# Patient Record
Sex: Male | Born: 1985 | Race: White | Hispanic: No | Marital: Single | Smoking: Never smoker
Health system: Southern US, Community
[De-identification: ages and names within clinical notes are randomized; demographics above are authoritative.]

---

## 2005-12-14 ENCOUNTER — Emergency Department: Payer: Self-pay | Admitting: Emergency Medicine

## 2007-06-21 IMAGING — CR DG ELBOW COMPLETE 3+V*L*
1 series · 4 of 4 positions shown · non-contrast
Comparison: none

REASON FOR EXAM: ELBOW PAIN
COMMENTS:

PROCEDURE:     DXR - DXR ELBOW LT COMP W/OBLIQUES  - December 14, 2005  [DATE]
RESULT:          There does not appear to be evidence of fracture,
dislocation, or malalignment.  No anterior or posterior fat pad signs are
appreciated.

[Series 1: view not recorded · 0.17mm/px · 4 of 4 slices shown]
[im 1/4]
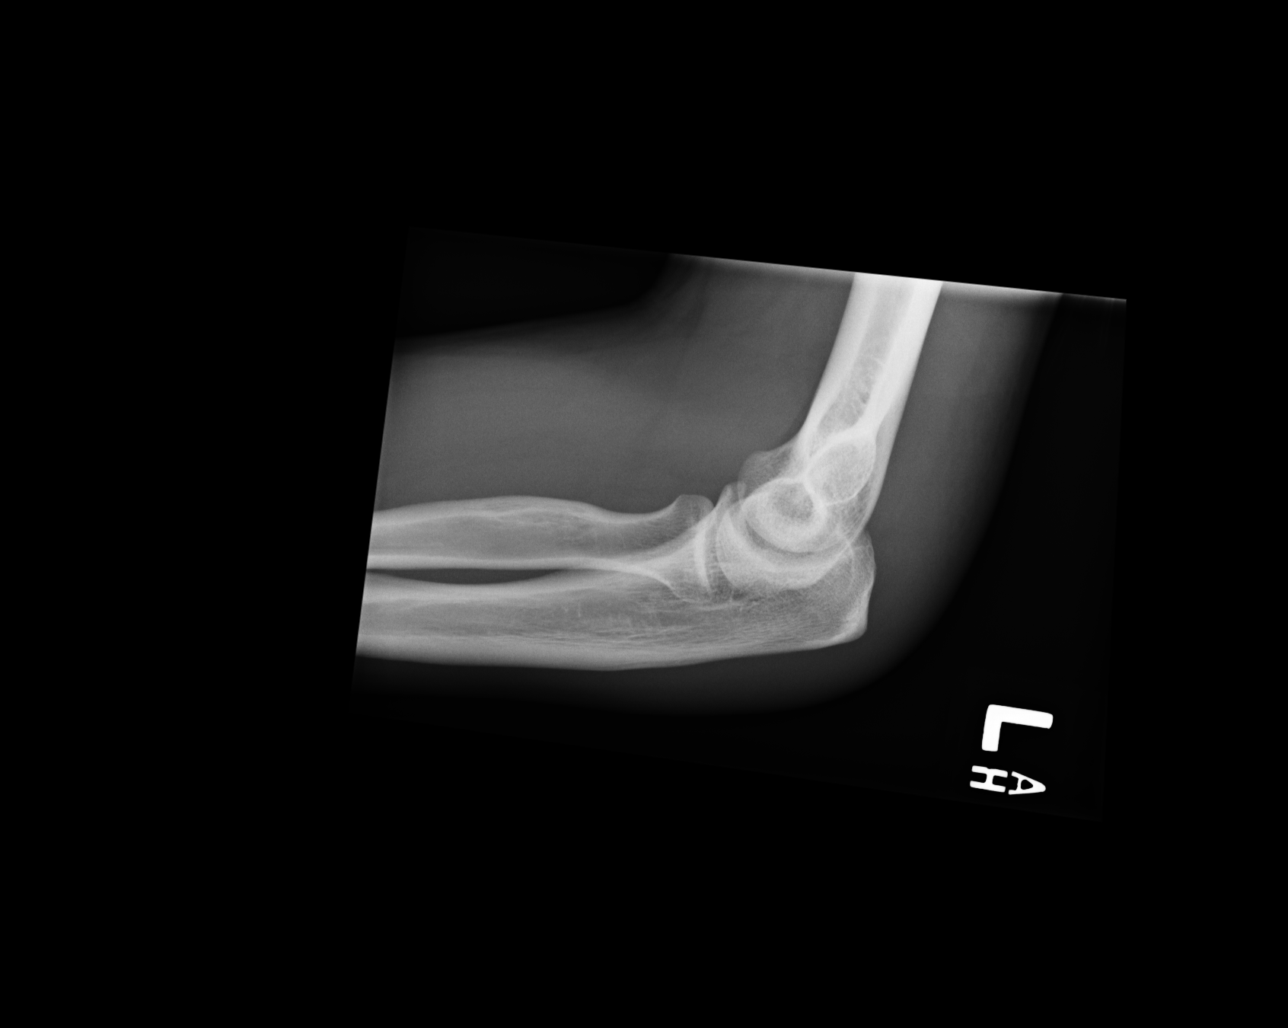
[im 2/4]
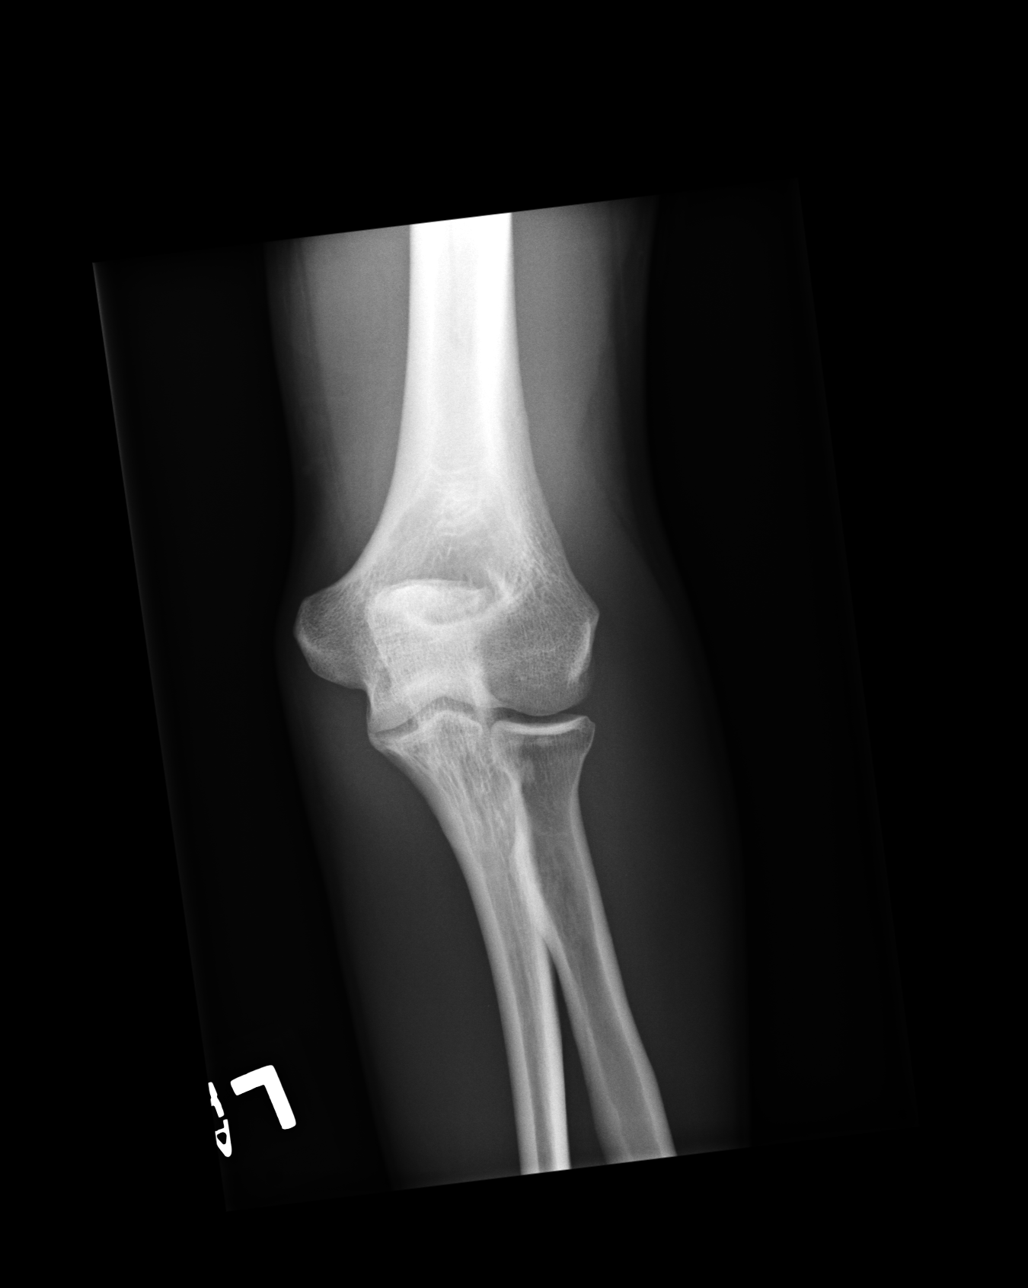
[im 3/4]
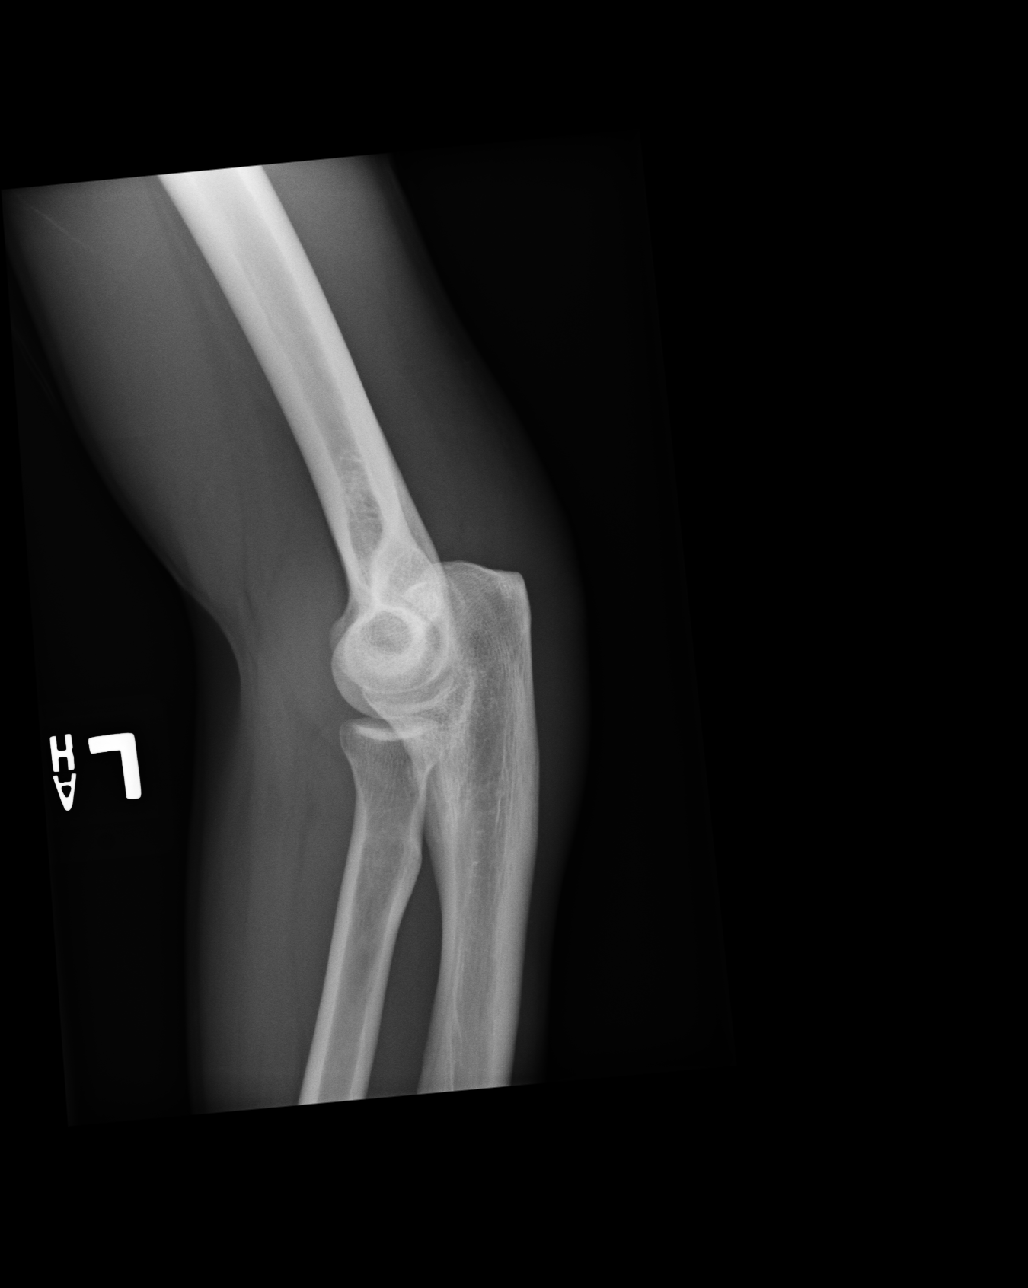
[im 4/4]
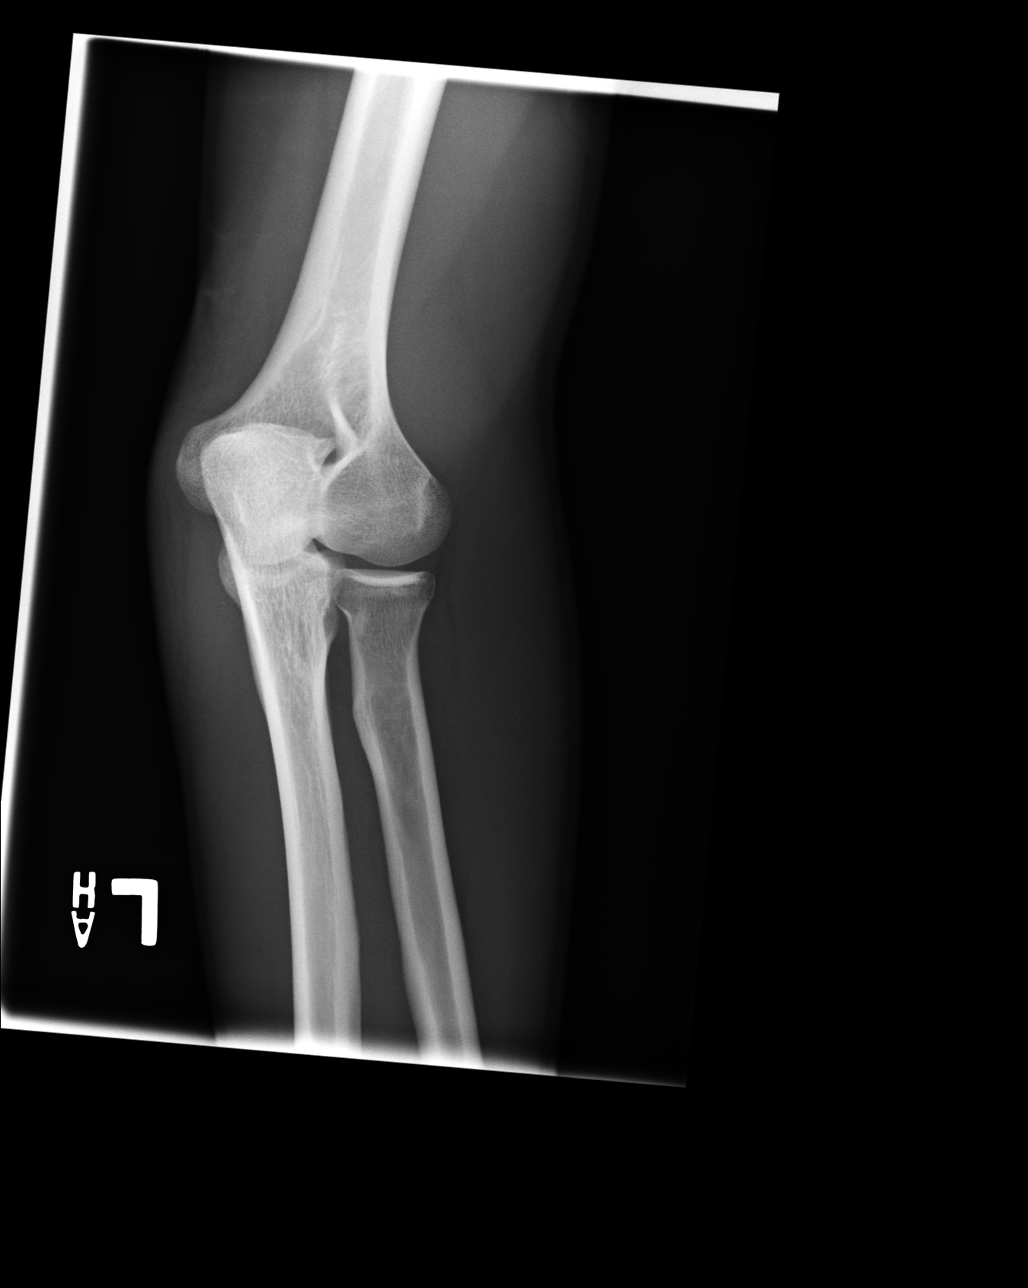

[4 of 4 positions shown; findings below may reference images not displayed]

IMPRESSION: No acute abnormalities involving the LEFT elbow. Repeat
evaluation in 7-10 days can be obtained if clinically warranted.

## 2018-01-17 ENCOUNTER — Ambulatory Visit
Admission: EM | Admit: 2018-01-17 | Discharge: 2018-01-17 | Disposition: A | Discharge status: Home / Self Care | Payer: BLUE CROSS/BLUE SHIELD | Attending: Emergency Medicine | Admitting: Emergency Medicine

## 2018-01-17 DIAGNOSIS — M791 Myalgia, unspecified site: Secondary | ICD-10-CM

## 2018-01-17 DIAGNOSIS — J3489 Other specified disorders of nose and nasal sinuses: Secondary | ICD-10-CM | POA: Diagnosis not present

## 2018-01-17 DIAGNOSIS — R69 Illness, unspecified: Principal | ICD-10-CM

## 2018-01-17 DIAGNOSIS — R51 Headache: Secondary | ICD-10-CM

## 2018-01-17 DIAGNOSIS — J111 Influenza due to unidentified influenza virus with other respiratory manifestations: Secondary | ICD-10-CM

## 2018-01-17 DIAGNOSIS — R0981 Nasal congestion: Secondary | ICD-10-CM

## 2018-01-17 DIAGNOSIS — J029 Acute pharyngitis, unspecified: Secondary | ICD-10-CM | POA: Diagnosis not present

## 2018-01-17 MED ORDER — FLUTICASONE PROPIONATE 50 MCG/ACT NA SUSP: 2.0000 | Freq: Every day | NASAL | 0 refills | Status: AC

## 2018-01-17 MED ORDER — OSELTAMIVIR PHOSPHATE 75 MG PO CAPS: 75.0000 mg | ORAL_CAPSULE | Freq: Two times a day (BID) | ORAL | 0 refills | Status: AC

## 2018-01-17 MED ORDER — IBUPROFEN 600 MG PO TABS: 600.0000 mg | ORAL_TABLET | Freq: Four times a day (QID) | ORAL | 0 refills | Status: AC | PRN

## 2018-01-17 NOTE — Discharge Instructions (Addendum)
Finish the Tamiflu, electrolyte containing fluids such as Pedialyte and Gatorade, take ibuprofen 600 mg with 1 g of Tylenol 3-4 times a day, Flonase, 2 puffs from your albuterol inhaler using a spacer every 4-6 hours or as needed for coughing, wheezing, shortness of breath.

## 2018-01-17 NOTE — ED Triage Notes (Signed)
Since yesterday. Fatigue, body aches, feeling bad, loss of appetite, fever last night and said he didn't get his flu shot. Took some tylenol last night

## 2018-01-17 NOTE — ED Provider Notes (Signed)
HPI  SUBJECTIVE:  Alec Moss is a 32 y.o. male who presents with acute onset of body aches, headaches, nasal congestion, rhinorrhea, postnasal drip, sore throat, nonproductive cough starting yesterday.  States he feels feverish but did not measure his temperature at home.  No sinus pain or pressure.  He reports wheezing, chest pain with coughing, shortness of breath and shortness of breath with exertion.  No abdominal pain, urinary complaints, vomiting, diarrhea, neck stiffness.  No aggravating or alleviating factors.  He did not try anything for this.  His partner has identical flulike symptoms.  He did not get a flu shot this year.  States that he is not coughing all night long.  He is a smoker, no history of asthma, eczema, COPD, diabetes, hypertension.  PMD: Dr. Pernell Dupre at [facility] primary [facility].  History reviewed. No pertinent past medical history.  History reviewed. No pertinent surgical history.  No family history on file.  Social History   Tobacco Use  . Smoking status: Never Smoker  . Smokeless tobacco: Never Used  Substance Use Topics  . Alcohol use: Not on file  . Drug use: Not on file    No current facility-administered medications for this encounter.   Current Outpatient Medications:  .  fluticasone (FLONASE) 50 MCG/ACT nasal spray, Place 2 sprays into both nostrils daily., Disp: 16 g, Rfl: 0 .  ibuprofen (ADVIL,MOTRIN) 600 MG tablet, Take 1 tablet (600 mg total) by mouth every 6 (six) hours as needed., Disp: 30 tablet, Rfl: 0 .  oseltamivir (TAMIFLU) 75 MG capsule, Take 1 capsule (75 mg total) by mouth 2 (two) times daily. X 5 days, Disp: 10 capsule, Rfl: 0  No Known Allergies   ROS  As noted in HPI.   Physical Exam  BP 129/83 (BP Location: Left Arm)   Pulse (!) 103   Temp 99.3 F (37.4 C) (Oral)   Resp 18   Wt 165 lb (74.8 kg)   SpO2 100%   Constitutional: Well developed, well nourished, no acute distress Eyes: PERRL, EOMI, conjunctiva normal  bilaterally HENT: Normocephalic, atraumatic,mucus membranes moist mild nasal congestion.  Normal turbinates.  No sinus tenderness.  Normal oropharynx.  Positive cobblestoning.  No postnasal drip. Neck: Negative cervical lymphadenopathy, meningismus Respiratory: Clear to auscultation bilaterally, no rales, no wheezing, no rhonchi.  Good air movement.  No chest wall tenderness. Cardiovascular: Mild regular tachycardia, no murmurs, no gallops, no rubs GI: Soft, nondistended, normal bowel sounds, nontender, no rebound, no guarding Back: no CVAT skin: No rash, skin intact Musculoskeletal: No edema, no tenderness, no deformities Neurologic: Alert & oriented x 3, CN II-XII grossly intact, no motor deficits, sensation grossly intact Psychiatric: Speech and behavior appropriate   ED Course   Medications - No data to display  No orders of the defined types were placed in this encounter.  No results found for this or any previous visit (from the past 24 hour(s)). No results found.  ED Clinical Impression  Influenza-like illness   ED Assessment/Plan   Presentation consistent with influenza-like illness.  Patient's  spouse is here with identical symptoms and she was treated for flu.  Plan to send home with Tamiflu, ibuprofen 600 mg with 1 g of Tylenol 3-4 times a day, Flonase, 2 puffs from an albuterol inhaler using a spacer every 4-6 hours or as needed for coughing, wheezing, shortness of breath.  doubt pneumonia.  Suspect that he has some lung disease from his history of smoking.  Follow-up with his primary care physician  as needed.  Discussed  MDM, treatment plan, and plan for follow-up with patient . patient agrees with plan.   Meds ordered this encounter  Medications  . fluticasone (FLONASE) 50 MCG/ACT nasal spray    Sig: Place 2 sprays into both nostrils daily.    Dispense:  16 g    Refill:  0  . ibuprofen (ADVIL,MOTRIN) 600 MG tablet    Sig: Take 1 tablet (600 mg total) by mouth  every 6 (six) hours as needed.    Dispense:  30 tablet    Refill:  0  . oseltamivir (TAMIFLU) 75 MG capsule    Sig: Take 1 capsule (75 mg total) by mouth 2 (two) times daily. X 5 days    Dispense:  10 capsule    Refill:  0    *This clinic note was created using Scientist, clinical (histocompatibility and immunogenetics)Dragon dictation software. Therefore, there may be occasional mistakes despite careful proofreading.  ?   Domenick GongMortenson, Corinn Stoltzfus, MD 01/17/18 2029

## 2021-07-10 ENCOUNTER — Ambulatory Visit: Payer: BLUE CROSS/BLUE SHIELD

## 2021-07-10 ENCOUNTER — Other Ambulatory Visit (HOSPITAL_COMMUNITY): Payer: Self-pay | Admitting: Family Medicine

## 2021-07-10 ENCOUNTER — Other Ambulatory Visit: Payer: Self-pay | Admitting: Family Medicine

## 2021-07-10 DIAGNOSIS — M7989 Other specified soft tissue disorders: Secondary | ICD-10-CM

## 2021-07-11 ENCOUNTER — Other Ambulatory Visit: Payer: Self-pay | Admitting: Family Medicine

## 2021-07-11 ENCOUNTER — Ambulatory Visit
Admission: RE | Admit: 2021-07-11 | Discharge: 2021-07-11 | Disposition: A | Discharge status: Home / Self Care | Payer: Self-pay | Source: Ambulatory Visit | Attending: Family Medicine | Admitting: Family Medicine

## 2021-07-11 ENCOUNTER — Other Ambulatory Visit: Payer: Self-pay

## 2021-07-11 DIAGNOSIS — M7989 Other specified soft tissue disorders: Secondary | ICD-10-CM

## 2023-01-16 IMAGING — US US EXTREM LOW VENOUS
1 series · 13 of 24 positions shown · non-contrast
Comparison: None.

CLINICAL DATA: Right calf pain and bruising, tenderness, swelling

EXAM:
BILATERAL LOWER EXTREMITY VENOUS DOPPLER ULTRASOUND
RIGHT LOWER EXTREMITY VENOUS DOPPLER ULTRASOUND
TECHNIQUE: Gray-scale sonography with graded compression, as well as color
Doppler and duplex ultrasound were performed to evaluate the lower
extremity deep venous systems from the level of the common femoral
vein and including the common femoral, femoral, profunda femoral,
popliteal and calf veins including the posterior tibial, peroneal
and gastrocnemius veins when visible. The superficial great
saphenous vein was also interrogated. Spectral Doppler was utilized
to evaluate flow at rest and with distal augmentation maneuvers in
the common femoral, femoral and popliteal veins.

[Series 1: us extrem low venous · 0.05mm/px · 13 of 72 slices shown]
[im 1/72]
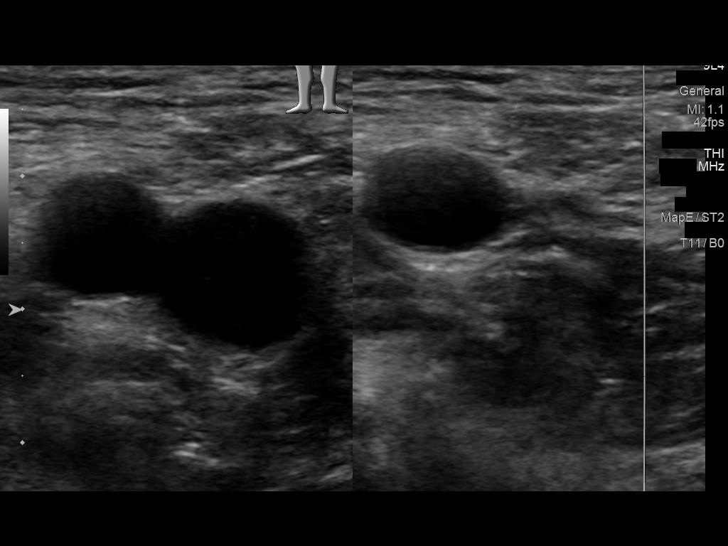
[im 7/72]
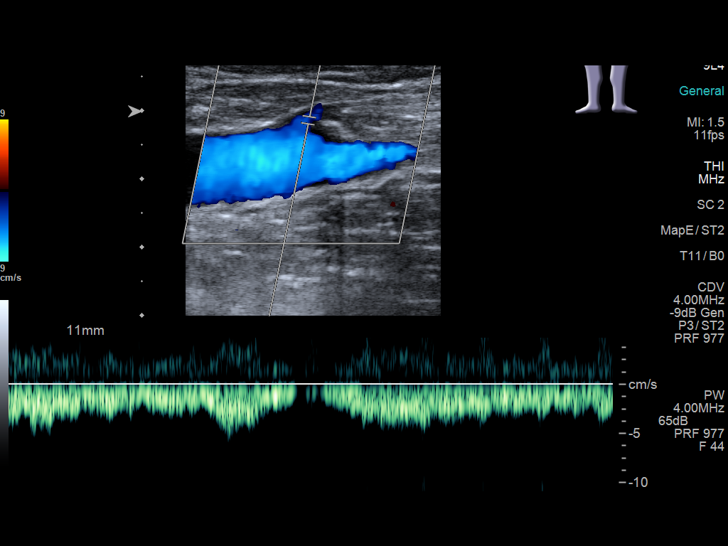
[im 13/72]
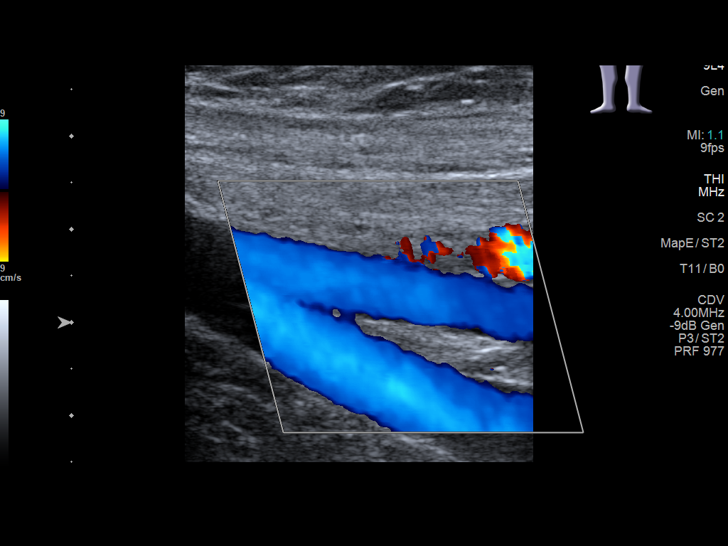
[im 19/72]
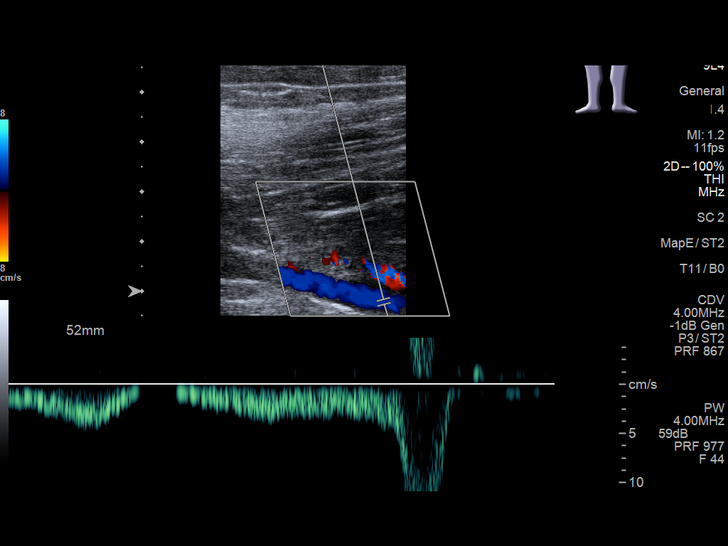
[im 25/72]
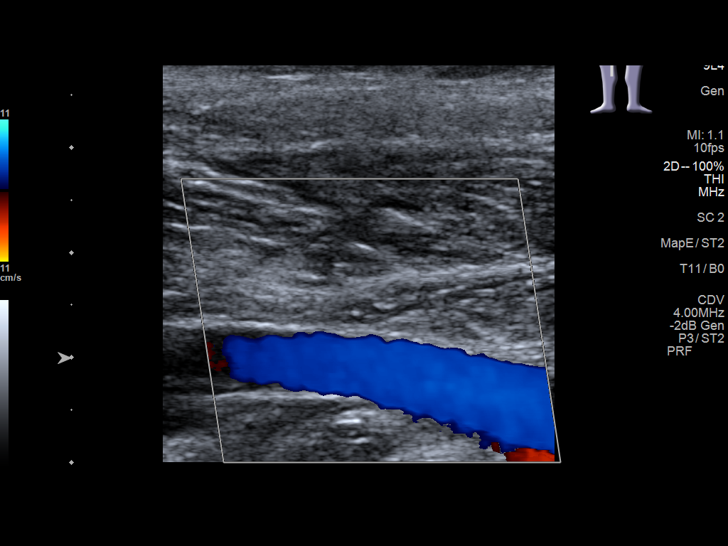
[im 31/72]
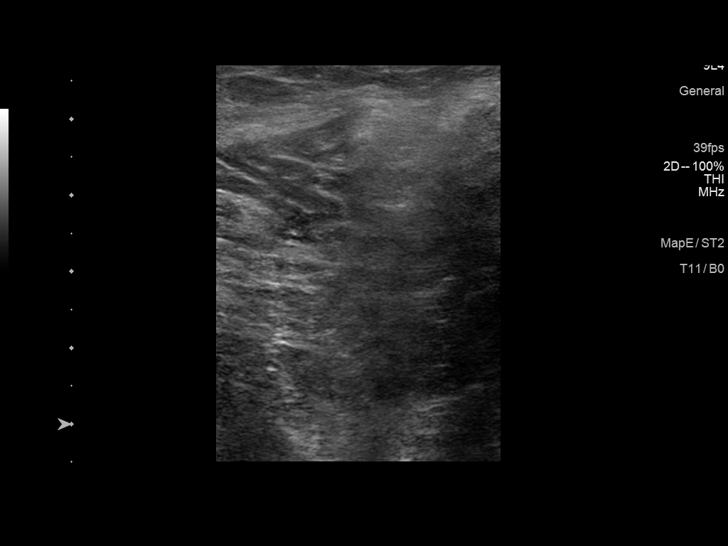
[im 38/72]
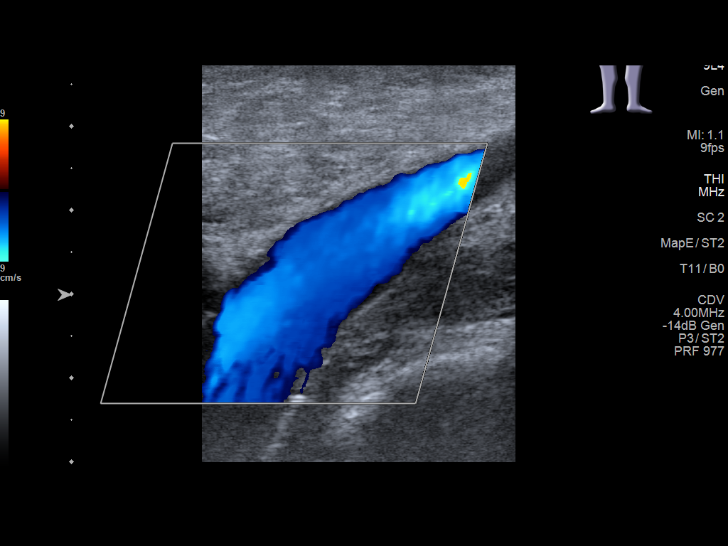
[im 41/72]
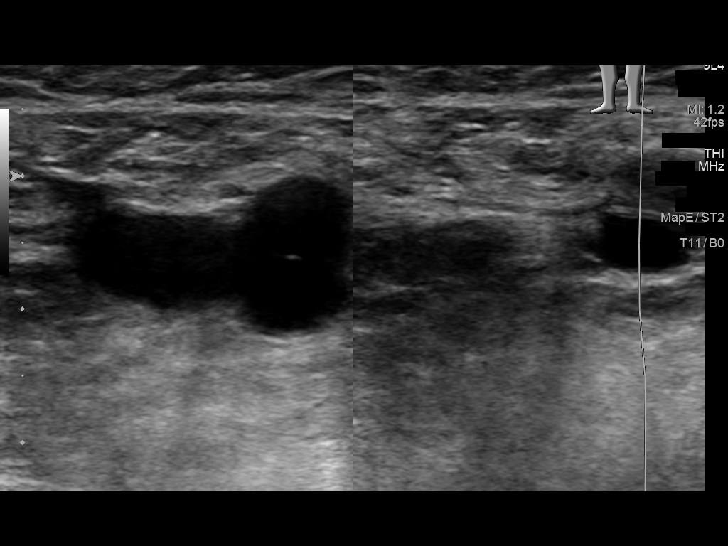
[im 47/72]
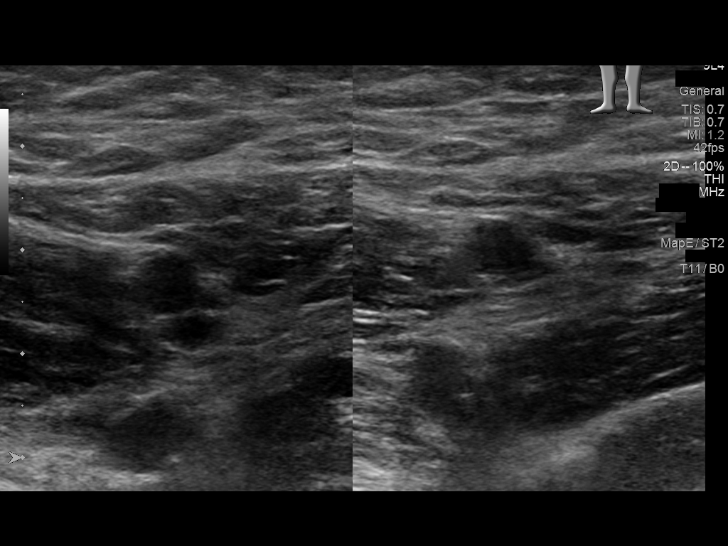
[im 53/72]
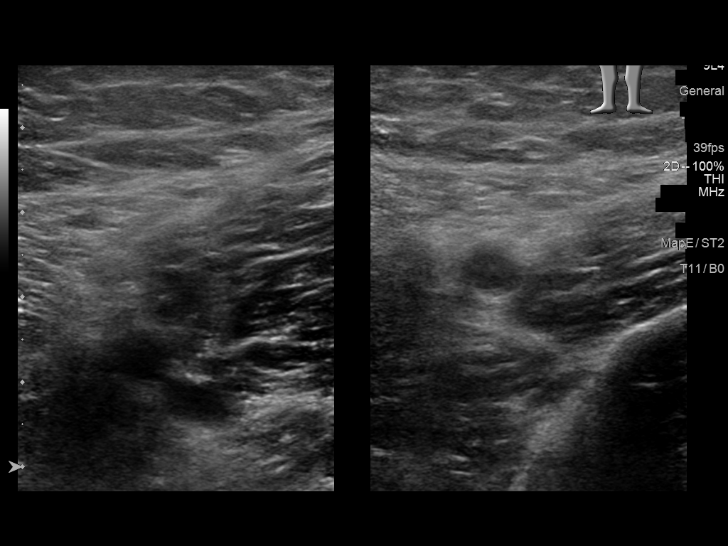
[im 59/72]
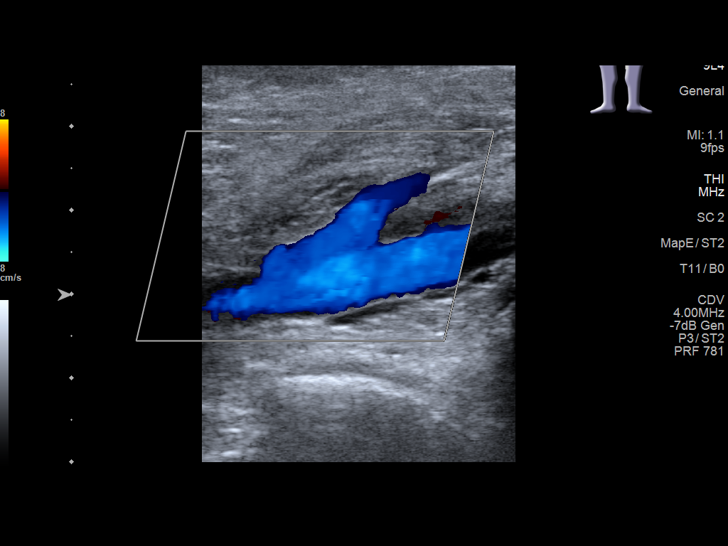
[im 65/72]
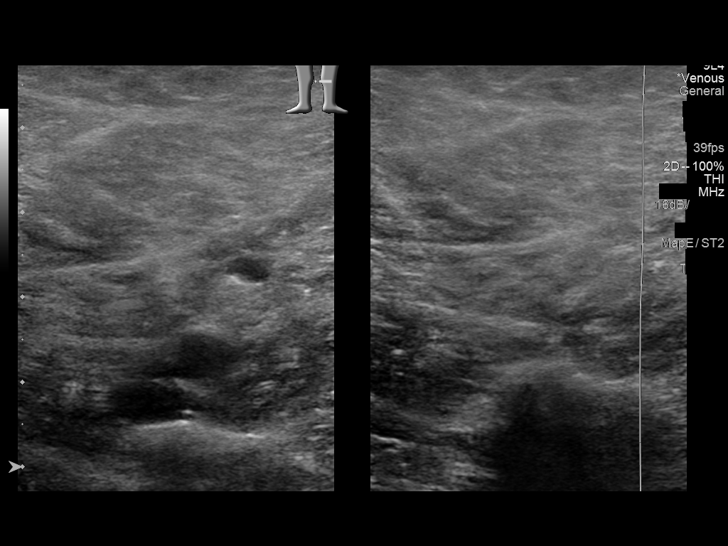
[im 72/72]
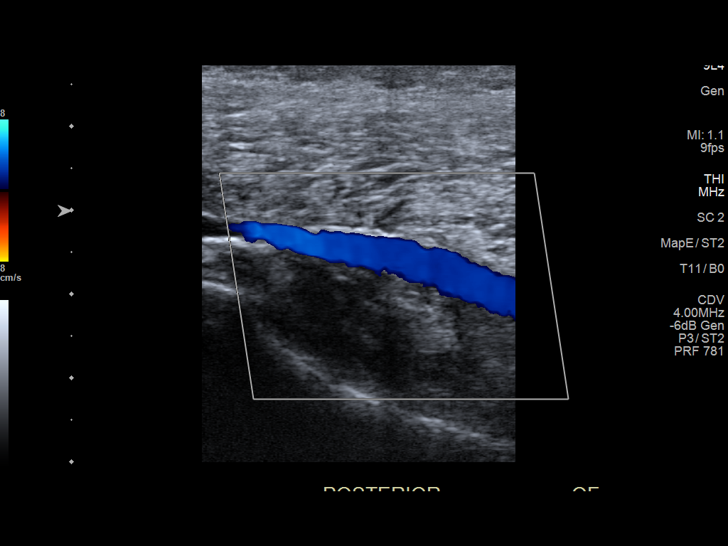

[13 of 24 positions shown; findings below may reference images not displayed]

FINDINGS: RIGHT LOWER EXTREMITY

Common Femoral Vein: No evidence of thrombus. Normal
compressibility, respiratory phasicity and response to augmentation.

Saphenofemoral Junction: No evidence of thrombus. Normal
compressibility and flow on color Doppler imaging.

Profunda Femoral Vein: No evidence of thrombus. Normal
compressibility and flow on color Doppler imaging.

Femoral Vein: No evidence of thrombus. Normal compressibility,
respiratory phasicity and response to augmentation.

Popliteal Vein: No evidence of thrombus. Normal compressibility,
respiratory phasicity and response to augmentation.

Calf Veins: No evidence of thrombus. Normal compressibility and flow
on color Doppler imaging.

Other Findings: Nonspecific trace strandy edema in the right
proximal medial calf subcutaneous tissues localized to the patient's
area of concern could represent minor subcutaneous bruising.

LEFT LOWER EXTREMITY

Common Femoral Vein: No evidence of thrombus. Normal
compressibility, respiratory phasicity and response to augmentation.

Saphenofemoral Junction: No evidence of thrombus. Normal
compressibility and flow on color Doppler imaging.

Profunda Femoral Vein: No evidence of thrombus. Normal
compressibility and flow on color Doppler imaging.

Femoral Vein: No evidence of thrombus. Normal compressibility,
respiratory phasicity and response to augmentation.

Popliteal Vein: No evidence of thrombus. Normal compressibility,
respiratory phasicity and response to augmentation.

Calf Veins: No evidence of thrombus. Normal compressibility and flow
on color Doppler imaging.

Other Findings: Left popliteal fossa Baker's cyst measures 2.3 x
x 1.4 cm
IMPRESSION: Negative for DVT in either extremity.

Other findings as above

## 2023-02-19 ENCOUNTER — Other Ambulatory Visit: Payer: Self-pay | Admitting: Family

## 2023-02-19 DIAGNOSIS — Z86718 Personal history of other venous thrombosis and embolism: Secondary | ICD-10-CM

## 2023-02-19 DIAGNOSIS — M79671 Pain in right foot: Secondary | ICD-10-CM

## 2023-02-19 DIAGNOSIS — M79661 Pain in right lower leg: Secondary | ICD-10-CM

## 2023-02-20 ENCOUNTER — Ambulatory Visit
Admission: RE | Admit: 2023-02-20 | Discharge: 2023-02-20 | Disposition: A | Discharge status: Home / Self Care | Payer: 59 | Source: Ambulatory Visit | Attending: Family | Admitting: Family

## 2023-02-20 DIAGNOSIS — Z86718 Personal history of other venous thrombosis and embolism: Secondary | ICD-10-CM | POA: Diagnosis present

## 2023-02-20 DIAGNOSIS — M79672 Pain in left foot: Secondary | ICD-10-CM | POA: Insufficient documentation

## 2023-02-20 DIAGNOSIS — M79671 Pain in right foot: Secondary | ICD-10-CM | POA: Insufficient documentation

## 2023-02-20 DIAGNOSIS — M79661 Pain in right lower leg: Secondary | ICD-10-CM | POA: Diagnosis not present
